# Patient Record
Sex: Male | Born: 1970 | Race: White | Hispanic: Yes | Marital: Single | Smoking: Current every day smoker
Health system: Southern US, Community
[De-identification: ages and names within clinical notes are randomized; demographics above are authoritative.]

---

## 2009-01-21 ENCOUNTER — Emergency Department (HOSPITAL_COMMUNITY): Admission: EM | Admit: 2009-01-21 | Discharge: 2009-01-21 | Payer: Self-pay | Admitting: Emergency Medicine
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
## 2017-10-11 ENCOUNTER — Encounter (HOSPITAL_COMMUNITY): Payer: Self-pay | Admitting: Emergency Medicine

## 2017-10-11 DIAGNOSIS — K3580 Unspecified acute appendicitis: Principal | ICD-10-CM | POA: Insufficient documentation

## 2017-10-11 LAB — COMPREHENSIVE METABOLIC PANEL
ALBUMIN: 4.4 g/dL (ref 3.5–5.0)
ALK PHOS: 113 U/L (ref 38–126)
ALT: 22 U/L (ref 17–63)
ANION GAP: 13 (ref 5–15)
AST: 21 U/L (ref 15–41)
BILIRUBIN TOTAL: 1.4 mg/dL — AB (ref 0.3–1.2)
BUN: 19 mg/dL (ref 6–20)
CALCIUM: 9.4 mg/dL (ref 8.9–10.3)
CO2: 20 mmol/L — ABNORMAL LOW (ref 22–32)
Chloride: 103 mmol/L (ref 101–111)
Creatinine, Ser: 0.99 mg/dL (ref 0.61–1.24)
GFR calc Af Amer: >60 mL/min (ref >60)
GLUCOSE: 131 mg/dL — AB (ref 65–99)
Potassium: 3.3 mmol/L — ABNORMAL LOW (ref 3.5–5.1)
Sodium: 136 mmol/L (ref 135–145)
TOTAL PROTEIN: 7.7 g/dL (ref 6.5–8.1)

## 2017-10-11 LAB — URINALYSIS, ROUTINE W REFLEX MICROSCOPIC
BACTERIA UA: NONE SEEN
Bilirubin Urine: NEGATIVE
GLUCOSE, UA: NEGATIVE mg/dL
Ketones, ur: 20 mg/dL — AB
Leukocytes, UA: NEGATIVE
NITRITE: NEGATIVE
Protein, ur: NEGATIVE mg/dL
SPECIFIC GRAVITY, URINE: 1.028 (ref 1.005–1.030)
Squamous Epithelial / LPF: NONE SEEN
pH: 6 (ref 5.0–8.0)

## 2017-10-11 LAB — CBC
HCT: 42.5 % (ref 39.0–52.0)
Hemoglobin: 15.1 g/dL (ref 13.0–17.0)
MCH: 30.6 pg (ref 26.0–34.0)
MCHC: 35.5 g/dL (ref 30.0–36.0)
MCV: 86.2 fL (ref 78.0–100.0)
Platelets: 266 10*3/uL (ref 150–400)
RBC: 4.93 MIL/uL (ref 4.22–5.81)
RDW: 13.4 % (ref 11.5–15.5)
WBC: 14.9 10*3/uL — AB (ref 4.0–10.5)

## 2017-10-11 LAB — LIPASE, BLOOD: Lipase: 26 U/L (ref 11–51)

## 2017-10-11 NOTE — ED Triage Notes (Signed)
Patient reports mid abdominal pain with emesis onset this afternoon , no diarrhea , denies fever or chills .  

## 2017-10-12 ENCOUNTER — Emergency Department (HOSPITAL_COMMUNITY): Payer: Self-pay | Admitting: Certified Registered Nurse Anesthetist

## 2017-10-12 ENCOUNTER — Encounter (HOSPITAL_COMMUNITY): Admission: EM | Disposition: A | Discharge status: Home / Self Care | Payer: Self-pay | Source: Home / Self Care

## 2017-10-12 ENCOUNTER — Other Ambulatory Visit: Payer: Self-pay

## 2017-10-12 ENCOUNTER — Observation Stay (HOSPITAL_COMMUNITY)
Admission: EM | Admit: 2017-10-12 | Discharge: 2017-10-13 | Disposition: A | Discharge status: Home / Self Care | Payer: Self-pay | Attending: Surgery | Admitting: Surgery

## 2017-10-12 ENCOUNTER — Encounter (HOSPITAL_COMMUNITY): Payer: Self-pay | Admitting: Surgery

## 2017-10-12 ENCOUNTER — Emergency Department (HOSPITAL_COMMUNITY): Payer: Self-pay

## 2017-10-12 DIAGNOSIS — K37 Unspecified appendicitis: Secondary | ICD-10-CM | POA: Diagnosis present

## 2017-10-12 DIAGNOSIS — K3580 Unspecified acute appendicitis: Secondary | ICD-10-CM | POA: Diagnosis present

## 2017-10-12 DIAGNOSIS — K3589 Other acute appendicitis without perforation or gangrene: Secondary | ICD-10-CM

## 2017-10-12 HISTORY — PX: LAPAROSCOPIC APPENDECTOMY: SHX408

## 2017-10-12 HISTORY — PX: APPENDECTOMY: SHX54

## 2017-10-12 SURGERY — APPENDECTOMY, LAPAROSCOPIC
Anesthesia: General

## 2017-10-12 MED ORDER — SUCCINYLCHOLINE CHLORIDE 20 MG/ML IJ SOLN
INTRAMUSCULAR | Status: DC | PRN
  Administered 2017-10-12: 110 mg via INTRAVENOUS

## 2017-10-12 MED ORDER — FENTANYL CITRATE (PF) 250 MCG/5ML IJ SOLN
INTRAMUSCULAR | Status: AC
  Filled 2017-10-12: qty 5

## 2017-10-12 MED ORDER — SUCCINYLCHOLINE CHLORIDE 200 MG/10ML IV SOSY
PREFILLED_SYRINGE | INTRAVENOUS | Status: AC
  Filled 2017-10-12: qty 10

## 2017-10-12 MED ORDER — OXYCODONE-ACETAMINOPHEN 5-325 MG PO TABS
1.0000 | ORAL_TABLET | ORAL | Status: DC | PRN
  Administered 2017-10-12 – 2017-10-13 (×2): 1 via ORAL
  Filled 2017-10-12: qty 1
  Filled 2017-10-12: qty 2

## 2017-10-12 MED ORDER — MORPHINE SULFATE (PF) 4 MG/ML IV SOLN
4.0000 mg | Freq: Once | INTRAVENOUS | Status: AC
  Administered 2017-10-12: 4 mg via INTRAVENOUS
  Filled 2017-10-12: qty 1

## 2017-10-12 MED ORDER — ROCURONIUM BROMIDE 100 MG/10ML IV SOLN
INTRAVENOUS | Status: DC | PRN
  Administered 2017-10-12: 50 mg via INTRAVENOUS

## 2017-10-12 MED ORDER — ONDANSETRON HCL 4 MG/2ML IJ SOLN
INTRAMUSCULAR | Status: AC
  Filled 2017-10-12: qty 2

## 2017-10-12 MED ORDER — SUGAMMADEX SODIUM 200 MG/2ML IV SOLN
INTRAVENOUS | Status: DC | PRN
  Administered 2017-10-12: 250.4 mg via INTRAVENOUS

## 2017-10-12 MED ORDER — METRONIDAZOLE IN NACL 5-0.79 MG/ML-% IV SOLN
500.0000 mg | Freq: Once | INTRAVENOUS | Status: AC
  Administered 2017-10-12: 500 mg via INTRAVENOUS
  Filled 2017-10-12: qty 100

## 2017-10-12 MED ORDER — EPHEDRINE 5 MG/ML INJ
INTRAVENOUS | Status: AC
  Filled 2017-10-12: qty 10

## 2017-10-12 MED ORDER — PHENYLEPHRINE 40 MCG/ML (10ML) SYRINGE FOR IV PUSH (FOR BLOOD PRESSURE SUPPORT)
PREFILLED_SYRINGE | INTRAVENOUS | Status: AC
  Filled 2017-10-12: qty 10

## 2017-10-12 MED ORDER — 0.9 % SODIUM CHLORIDE (POUR BTL) OPTIME
TOPICAL | Status: DC | PRN
  Administered 2017-10-12: 1000 mL

## 2017-10-12 MED ORDER — LACTATED RINGERS IV SOLN
INTRAVENOUS | Status: DC
  Administered 2017-10-12 (×2): via INTRAVENOUS

## 2017-10-12 MED ORDER — DEXAMETHASONE SODIUM PHOSPHATE 10 MG/ML IJ SOLN
INTRAMUSCULAR | Status: AC
  Filled 2017-10-12: qty 1

## 2017-10-12 MED ORDER — CEFTRIAXONE SODIUM 2 G IJ SOLR
2.0000 g | INTRAMUSCULAR | Status: DC
  Administered 2017-10-13: 2 g via INTRAVENOUS
  Filled 2017-10-12: qty 2

## 2017-10-12 MED ORDER — MIDAZOLAM HCL 2 MG/2ML IJ SOLN
INTRAMUSCULAR | Status: AC
  Filled 2017-10-12: qty 2

## 2017-10-12 MED ORDER — SODIUM CHLORIDE 0.9 % IV SOLN
INTRAVENOUS | Status: DC
  Administered 2017-10-12 – 2017-10-13 (×2): via INTRAVENOUS

## 2017-10-12 MED ORDER — FENTANYL CITRATE (PF) 100 MCG/2ML IJ SOLN
INTRAMUSCULAR | Status: DC | PRN
  Administered 2017-10-12: 100 ug via INTRAVENOUS

## 2017-10-12 MED ORDER — MORPHINE SULFATE (PF) 4 MG/ML IV SOLN: 2.0000 mg | INTRAVENOUS | Status: DC | PRN

## 2017-10-12 MED ORDER — IOPAMIDOL (ISOVUE-300) INJECTION 61%
INTRAVENOUS | Status: AC
  Administered 2017-10-12: 100 mL
  Filled 2017-10-12: qty 100

## 2017-10-12 MED ORDER — PROPOFOL 10 MG/ML IV BOLUS
INTRAVENOUS | Status: DC | PRN
  Administered 2017-10-12: 200 mg via INTRAVENOUS

## 2017-10-12 MED ORDER — HYDRALAZINE HCL 20 MG/ML IJ SOLN: 10.0000 mg | INTRAMUSCULAR | Status: DC | PRN

## 2017-10-12 MED ORDER — ONDANSETRON HCL 4 MG/2ML IJ SOLN: 4.0000 mg | Freq: Four times a day (QID) | INTRAMUSCULAR | Status: DC | PRN

## 2017-10-12 MED ORDER — PANTOPRAZOLE SODIUM 40 MG IV SOLR
40.0000 mg | Freq: Every day | INTRAVENOUS | Status: DC
  Administered 2017-10-12: 40 mg via INTRAVENOUS
  Filled 2017-10-12: qty 40

## 2017-10-12 MED ORDER — INFLUENZA VAC SPLIT QUAD 0.5 ML IM SUSY
0.5000 mL | PREFILLED_SYRINGE | INTRAMUSCULAR | Status: DC
  Filled 2017-10-12: qty 0.5

## 2017-10-12 MED ORDER — GLYCOPYRROLATE 0.2 MG/ML IJ SOLN
INTRAMUSCULAR | Status: DC | PRN
  Administered 2017-10-12: 0.2 mg via INTRAVENOUS

## 2017-10-12 MED ORDER — BUPIVACAINE-EPINEPHRINE (PF) 0.25% -1:200000 IJ SOLN
INTRAMUSCULAR | Status: AC
  Filled 2017-10-12: qty 30

## 2017-10-12 MED ORDER — SODIUM CHLORIDE 0.9 % IR SOLN
Status: DC | PRN
  Administered 2017-10-12: 1000 mL

## 2017-10-12 MED ORDER — SUGAMMADEX SODIUM 200 MG/2ML IV SOLN
INTRAVENOUS | Status: AC
  Filled 2017-10-12: qty 2

## 2017-10-12 MED ORDER — DEXAMETHASONE SODIUM PHOSPHATE 10 MG/ML IJ SOLN
INTRAMUSCULAR | Status: DC | PRN
  Administered 2017-10-12: 10 mg via INTRAVENOUS

## 2017-10-12 MED ORDER — OXYCODONE HCL 5 MG PO TABS
5.0000 mg | ORAL_TABLET | ORAL | Status: DC | PRN
Start: 2017-10-12 — End: 2017-10-13

## 2017-10-12 MED ORDER — LIDOCAINE HCL (CARDIAC) 20 MG/ML IV SOLN
INTRAVENOUS | Status: DC | PRN
  Administered 2017-10-12: 100 mg via INTRAVENOUS

## 2017-10-12 MED ORDER — DIPHENHYDRAMINE HCL 25 MG PO CAPS: 25.0000 mg | ORAL_CAPSULE | Freq: Four times a day (QID) | ORAL | Status: DC | PRN

## 2017-10-12 MED ORDER — DEXTROSE 5 % IV SOLN
2.0000 g | Freq: Once | INTRAVENOUS | Status: AC
  Administered 2017-10-12: 2 g via INTRAVENOUS
  Filled 2017-10-12: qty 2

## 2017-10-12 MED ORDER — ROCURONIUM BROMIDE 10 MG/ML (PF) SYRINGE
PREFILLED_SYRINGE | INTRAVENOUS | Status: AC
  Filled 2017-10-12: qty 5

## 2017-10-12 MED ORDER — ONDANSETRON 4 MG PO TBDP: 4.0000 mg | ORAL_TABLET | Freq: Four times a day (QID) | ORAL | Status: DC | PRN

## 2017-10-12 MED ORDER — DIPHENHYDRAMINE HCL 50 MG/ML IJ SOLN: 25.0000 mg | Freq: Four times a day (QID) | INTRAMUSCULAR | Status: DC | PRN

## 2017-10-12 MED ORDER — BUPIVACAINE-EPINEPHRINE 0.5% -1:200000 IJ SOLN
INTRAMUSCULAR | Status: DC | PRN
  Administered 2017-10-12: 13 mL

## 2017-10-12 MED ORDER — MIDAZOLAM HCL 5 MG/5ML IJ SOLN
INTRAMUSCULAR | Status: DC | PRN
  Administered 2017-10-12: 2 mg via INTRAVENOUS

## 2017-10-12 MED ORDER — PROPOFOL 10 MG/ML IV BOLUS
INTRAVENOUS | Status: AC
  Filled 2017-10-12: qty 20

## 2017-10-12 MED ORDER — LIDOCAINE 2% (20 MG/ML) 5 ML SYRINGE
INTRAMUSCULAR | Status: AC
  Filled 2017-10-12: qty 5

## 2017-10-12 MED ORDER — METRONIDAZOLE IN NACL 5-0.79 MG/ML-% IV SOLN
500.0000 mg | Freq: Three times a day (TID) | INTRAVENOUS | Status: DC
  Administered 2017-10-12 – 2017-10-13 (×3): 500 mg via INTRAVENOUS
  Filled 2017-10-12 (×5): qty 100

## 2017-10-12 MED ORDER — ONDANSETRON HCL 4 MG/2ML IJ SOLN
4.0000 mg | Freq: Once | INTRAMUSCULAR | Status: AC
  Administered 2017-10-12: 4 mg via INTRAVENOUS
  Filled 2017-10-12: qty 2

## 2017-10-12 MED ORDER — ONDANSETRON HCL 4 MG/2ML IJ SOLN
INTRAMUSCULAR | Status: DC | PRN
  Administered 2017-10-12: 4 mg via INTRAVENOUS

## 2017-10-12 SURGICAL SUPPLY — 44 items
APPLIER CLIP ROT 10 11.4 M/L (STAPLE)
BLADE CLIPPER SURG (BLADE) IMPLANT
CANISTER SUCT 3000ML PPV (MISCELLANEOUS) ×3 IMPLANT
CHLORAPREP W/TINT 26ML (MISCELLANEOUS) ×3 IMPLANT
CLIP APPLIE ROT 10 11.4 M/L (STAPLE) IMPLANT
COVER SURGICAL LIGHT HANDLE (MISCELLANEOUS) ×3 IMPLANT
CUTTER FLEX LINEAR 45M (STAPLE) ×3 IMPLANT
DERMABOND ADHESIVE PROPEN (GAUZE/BANDAGES/DRESSINGS) ×2
DERMABOND ADVANCED (GAUZE/BANDAGES/DRESSINGS) ×2
DERMABOND ADVANCED .7 DNX12 (GAUZE/BANDAGES/DRESSINGS) ×1 IMPLANT
DERMABOND ADVANCED .7 DNX6 (GAUZE/BANDAGES/DRESSINGS) ×1 IMPLANT
DRAPE WARM FLUID 44X44 (DRAPE) ×3 IMPLANT
ELECT REM PT RETURN 9FT ADLT (ELECTROSURGICAL) ×3
ELECTRODE REM PT RTRN 9FT ADLT (ELECTROSURGICAL) ×1 IMPLANT
ENDOLOOP SUT PDS II  0 18 (SUTURE)
ENDOLOOP SUT PDS II 0 18 (SUTURE) IMPLANT
GLOVE BIO SURGEON STRL SZ8 (GLOVE) ×3 IMPLANT
GLOVE BIOGEL PI IND STRL 6.5 (GLOVE) ×1 IMPLANT
GLOVE BIOGEL PI IND STRL 8 (GLOVE) ×1 IMPLANT
GLOVE BIOGEL PI INDICATOR 6.5 (GLOVE) ×2
GLOVE BIOGEL PI INDICATOR 8 (GLOVE) ×2
GLOVE SURG SS PI 6.5 STRL IVOR (GLOVE) ×3 IMPLANT
GOWN STRL REUS W/ TWL LRG LVL3 (GOWN DISPOSABLE) ×2 IMPLANT
GOWN STRL REUS W/ TWL XL LVL3 (GOWN DISPOSABLE) ×1 IMPLANT
GOWN STRL REUS W/TWL LRG LVL3 (GOWN DISPOSABLE) ×4
GOWN STRL REUS W/TWL XL LVL3 (GOWN DISPOSABLE) ×2
KIT BASIN OR (CUSTOM PROCEDURE TRAY) ×3 IMPLANT
KIT ROOM TURNOVER OR (KITS) ×3 IMPLANT
NS IRRIG 1000ML POUR BTL (IV SOLUTION) ×3 IMPLANT
PAD ARMBOARD 7.5X6 YLW CONV (MISCELLANEOUS) ×6 IMPLANT
POUCH SPECIMEN RETRIEVAL 10MM (ENDOMECHANICALS) ×3 IMPLANT
RELOAD STAPLE TA45 3.5 REG BLU (ENDOMECHANICALS) ×6 IMPLANT
SCISSORS LAP 5X35 DISP (ENDOMECHANICALS) ×3 IMPLANT
SET IRRIG TUBING LAPAROSCOPIC (IRRIGATION / IRRIGATOR) ×3 IMPLANT
SHEARS HARMONIC ACE PLUS 36CM (ENDOMECHANICALS) ×3 IMPLANT
SPECIMEN JAR SMALL (MISCELLANEOUS) ×3 IMPLANT
SUT MON AB 4-0 PC3 18 (SUTURE) ×3 IMPLANT
TOWEL OR 17X24 6PK STRL BLUE (TOWEL DISPOSABLE) ×3 IMPLANT
TOWEL OR 17X26 10 PK STRL BLUE (TOWEL DISPOSABLE) ×3 IMPLANT
TRAY FOLEY CATH SILVER 16FR (SET/KITS/TRAYS/PACK) ×3 IMPLANT
TRAY LAPAROSCOPIC MC (CUSTOM PROCEDURE TRAY) ×3 IMPLANT
TROCAR XCEL BLADELESS 5X75MML (TROCAR) ×6 IMPLANT
TROCAR XCEL BLUNT TIP 100MML (ENDOMECHANICALS) ×3 IMPLANT
TUBING INSUFFLATION (TUBING) ×3 IMPLANT

## 2017-10-12 NOTE — H&P (Signed)
Douglas Rojas  Douglas Rojas 1971/10/27  [id number].    Requesting Provider: Quincy Carnes, PA-C Chief Complaint/Reason for Consult: acute apppendicitis  HPI:  [name] is a 46 yo male with no significant pmhx who presented to [facility] due to acute RLQ pain. He states the pain began yesterday around 1500 and was diffuse across his lower abdomen that then localized to his RLQ. Pain is intense, throbbing, aggravated by lying supine with no alleviating factors at home. He also reports nausea and vomiting. Last episode of emesis he describes as green. Last BM yesterday - no melena, hematochezia. He denies fever, chills, chest pain, SHOB, urinary symptoms.  He has no past surgical history. Takes no medications. LOI yesterday at 1330.  ROS: Review of Systems  Constitutional: Negative for chills and fever.  Respiratory: Negative for shortness of breath.   Cardiovascular: Negative for chest pain and palpitations.  Gastrointestinal: Positive for abdominal pain and vomiting. Negative for blood in stool and melena.  Genitourinary: Negative.   Skin: Negative for rash.    No family history on file.  History reviewed. No pertinent past medical history.  History reviewed. No pertinent surgical history.  Social History:  reports that he has been smoking.  he has never used smokeless tobacco. He reports that he drinks alcohol. He reports that he does not use drugs.  Allergies: No Known Allergies   (Not in a hospital admission)  Blood pressure 120/81, pulse (!) 50, temperature 98.5 F (36.9 C), temperature source Oral, resp. rate 14, height 5' 5"  (1.651 m), weight 62.6 kg (138 lb), SpO2 99 %. Physical Exam: Physical Exam  Constitutional: He is oriented to person, place, and time. He appears well-developed and well-nourished.  Non-toxic appearance. No distress.  HENT:  Head: Normocephalic and atraumatic.  Eyes: Pupils are equal, round, and reactive to light.   Cardiovascular: Normal rate, regular rhythm, normal heart sounds and intact distal pulses. Exam reveals no gallop and no friction rub.  No murmur heard. Pulmonary/Chest: Effort normal and breath sounds normal. No respiratory distress.  Abdominal: Soft. Normal appearance and bowel sounds are normal. There is tenderness in the right lower quadrant. There is tenderness at McBurney's point. There is negative Murphy's sign.  Neurological: He is alert and oriented to person, place, and time.  Skin: Skin is warm and dry. Capillary refill takes less than 2 seconds.  Psychiatric: He has a normal mood and affect. His behavior is normal.    Results for orders placed or performed during the hospital encounter of 10/12/17 (from the past 48 hour(s))  Lipase, blood     Status: None   Collection Time: 10/11/17  7:51 PM  Result Value Ref Range   Lipase 26 11 - 51 U/L  Comprehensive metabolic panel     Status: Abnormal   Collection Time: 10/11/17  7:51 PM  Result Value Ref Range   Sodium 136 135 - 145 mmol/L   Potassium 3.3 (L) 3.5 - 5.1 mmol/L   Chloride 103 101 - 111 mmol/L   CO2 20 (L) 22 - 32 mmol/L   Glucose, Bld 131 (H) 65 - 99 mg/dL   BUN 19 6 - 20 mg/dL   Creatinine, Ser 0.99 0.61 - 1.24 mg/dL   Calcium 9.4 8.9 - 10.3 mg/dL   Total Protein 7.7 6.5 - 8.1 g/dL   Albumin 4.4 3.5 - 5.0 g/dL   AST 21 15 - 41 U/L   ALT 22 17 - 63 U/L   Alkaline Phosphatase  113 38 - 126 U/L   Total Bilirubin 1.4 (H) 0.3 - 1.2 mg/dL   GFR calc non Af Amer >60 >60 mL/min   GFR calc Af Amer >60 >60 mL/min    Comment: (Rojas) The eGFR has been calculated using the CKD EPI equation. This calculation has not been validated in all clinical situations. eGFR's persistently <60 mL/min signify possible Chronic Kidney Disease.    Anion gap 13 5 - 15  CBC     Status: Abnormal   Collection Time: 10/11/17  7:51 PM  Result Value Ref Range   WBC 14.9 (H) 4.0 - 10.5 K/uL   RBC 4.93 4.22 - 5.81 MIL/uL   Hemoglobin 15.1  13.0 - 17.0 g/dL   HCT 42.5 39.0 - 52.0 %   MCV 86.2 78.0 - 100.0 fL   MCH 30.6 26.0 - 34.0 pg   MCHC 35.5 30.0 - 36.0 g/dL   RDW 13.4 11.5 - 15.5 %   Platelets 266 150 - 400 K/uL  Urinalysis, Routine w reflex microscopic     Status: Abnormal   Collection Time: 10/11/17  7:53 PM  Result Value Ref Range   Color, Urine YELLOW YELLOW   APPearance CLEAR CLEAR   Specific Gravity, Urine 1.028 1.005 - 1.030   pH 6.0 5.0 - 8.0   Glucose, UA NEGATIVE NEGATIVE mg/dL   Hgb urine dipstick MODERATE (A) NEGATIVE   Bilirubin Urine NEGATIVE NEGATIVE   Ketones, ur 20 (A) NEGATIVE mg/dL   Protein, ur NEGATIVE NEGATIVE mg/dL   Nitrite NEGATIVE NEGATIVE   Leukocytes, UA NEGATIVE NEGATIVE   RBC / HPF 0-5 0 - 5 RBC/hpf   WBC, UA 0-5 0 - 5 WBC/hpf   Bacteria, UA NONE SEEN NONE SEEN   Squamous Epithelial / LPF NONE SEEN NONE SEEN   Mucus PRESENT    Ct Abdomen Pelvis W Contrast  Result Date: 10/12/2017 CLINICAL DATA:  46 year old male with abdominal pain. Concern for acute appendicitis. EXAM: CT ABDOMEN AND PELVIS WITH CONTRAST TECHNIQUE: Multidetector CT imaging of the abdomen and pelvis was performed using the standard protocol following bolus administration of intravenous contrast. CONTRAST:  115m ISOVUE-300 IOPAMIDOL (ISOVUE-300) INJECTION 61% COMPARISON:  None. FINDINGS: Evaluation of this exam is limited due to respiratory motion artifact. Lower chest: Minimal bibasilar atelectatic changes. The visualized lung bases are otherwise clear. Borderline cardiomegaly. No intra-abdominal free air or free fluid. Hepatobiliary: No focal liver abnormality is seen. No gallstones, gallbladder wall thickening, or biliary dilatation. Pancreas: Unremarkable. No pancreatic ductal dilatation or surrounding inflammatory changes. Spleen: Normal in size without focal abnormality. Adrenals/Urinary Tract: Adrenal glands are unremarkable. Kidneys are normal, without renal calculi, focal lesion, or hydronephrosis. Bladder is  unremarkable. Stomach/Bowel: There is no evidence of bowel obstruction. The appendix appears thickened measuring 13 mm in diameter. There is mild enhancement of the appendiceal mucosa. No significant periappendiceal inflammatory changes. There is a small amount of fluid within the lumen of the appendix. Tiny noncalcified appendicolith is likely present in the distal lumen and tip of the appendix. The appendix is located in the right lower quadrant inferior to the cecum extending inferiorly into the right hemipelvis. There is no evidence of appendiceal perforation or abscess. Vascular/Lymphatic: No significant vascular findings are present. No enlarged abdominal or pelvic lymph nodes. Reproductive: The prostate and seminal vesicles are grossly unremarkable. Other: None Musculoskeletal: No acute or significant osseous findings. IMPRESSION: Prominent appendix with enhancing mucosa and probable noncalcified appendicolith in the distal lumen. No significant periappendiceal inflammation. Findings likely represent  an early acute appendicitis. Clinical correlation is recommended. No abscess or perforation. Evaluation is somewhat limited due to respiratory motion. Electronically Signed   By: Anner Crete M.D.   On: 10/12/2017 06:09    Assessment/Plan Acute appendicitis - CT showing prominent appendix with enhancing mucosa and probable appendicolith in the distal lumen. No periappendiceal inflammation, abscess, or perforation. - likely to OR today for laparoscopic appendectomy - NPO and IV pain control - prophylactic abx  1.  Admit to [place] service 2.  NPO, IVF 3.  SCD's  FEN: NPO, IVF ID: ceftriaxone 12/4>> DVT: SCDs  Tonny Branch, PA-S2  10/12/2017, 7:02 AM

## 2017-10-12 NOTE — Anesthesia Preprocedure Evaluation (Signed)
Anesthesia Evaluation  Patient identified by MRN, date of birth, ID band Patient awake    Reviewed: Allergy & Precautions, NPO status , Patient's Chart, lab work & pertinent test results  Airway Mallampati: II   Neck ROM: Full    Dental no notable dental hx.    Pulmonary Current Smoker,    breath sounds clear to auscultation       Cardiovascular  Rhythm:Regular Rate:Normal     Neuro/Psych negative neurological ROS     GI/Hepatic Neg liver ROS, Appendicitis    Endo/Other  negative endocrine ROS  Renal/GU negative Renal ROS     Musculoskeletal   Abdominal   Peds  Hematology negative hematology ROS (+)   Anesthesia Other Findings   Reproductive/Obstetrics                             Anesthesia Physical Anesthesia Plan  ASA: I  Anesthesia Plan: General   Post-op Pain Management:    Induction: Intravenous  PONV Risk Score and Plan: 2 and Treatment may vary due to age or medical condition  Airway Management Planned: Oral ETT  Additional Equipment:   Intra-op Plan:   Post-operative Plan: Extubation in OR  Informed Consent: I have reviewed the patients History and Physical, chart, labs and discussed the procedure including the risks, benefits and alternatives for the proposed anesthesia with the patient or authorized representative who has indicated his/her understanding and acceptance.   Dental advisory given  Plan Discussed with: CRNA  Anesthesia Plan Comments:         Anesthesia Quick Evaluation

## 2017-10-12 NOTE — Anesthesia Procedure Notes (Signed)
Procedure Name: Intubation Date/Time: 10/12/2017 11:19 AM Performed by: Shirlyn Goltz, CRNA Pre-anesthesia Checklist: Patient identified, Emergency Drugs available, Suction available and Patient being monitored Patient Re-evaluated:Patient Re-evaluated prior to induction Oxygen Delivery Method: Circle system utilized Preoxygenation: Pre-oxygenation with 100% oxygen Induction Type: IV induction and Rapid sequence Laryngoscope Size: Mac and 4 Grade View: Grade I Tube type: Oral Tube size: 7.5 mm Number of attempts: 1 Airway Equipment and Method: Stylet Placement Confirmation: ETT inserted through vocal cords under direct vision,  positive ETCO2 and breath sounds checked- equal and bilateral Secured at: 21 cm Tube secured with: Tape Dental Injury: Teeth and Oropharynx as per pre-operative assessment

## 2017-10-12 NOTE — Transfer of Care (Signed)
Immediate Anesthesia Transfer of Care Note  Patient: Douglas Rojas  Procedure(s) Performed: APPENDECTOMY LAPAROSCOPIC (N/A )  Patient Location: PACU  Anesthesia Type:General  Level of Consciousness: awake, alert , oriented and patient cooperative  Airway & Oxygen Therapy: Patient Spontanous Breathing and Patient connected to nasal cannula oxygen  Post-op Assessment: Report given to RN and Post -op Vital signs reviewed and stable  Post vital signs: Reviewed and stable  Last Vitals:  Vitals:   10/12/17 0945 10/12/17 1000  BP: 101/71 96/69  Pulse: (!) 45 77  Resp:    Temp:    SpO2: 96% 100%    Last Pain:  Vitals:   10/12/17 0730  TempSrc:   PainSc: 2          Complications: No apparent anesthesia complications

## 2017-10-12 NOTE — Interval H&P Note (Signed)
History and Physical Interval Note:  10/12/2017 11:05 AM  Douglas Rojas  has presented today for surgery, with the diagnosis of ACUTE APPENDICITIS  The various methods of treatment have been discussed with the patient and family. After consideration of risks, benefits and other options for treatment, the patient has consented to  Procedure(s): APPENDECTOMY LAPAROSCOPIC (N/A) as a surgical intervention .  The patient's history has been reviewed, patient examined, no change in status, stable for surgery.  I have reviewed the patient's chart and labs.  Questions were answered to the patient's satisfaction.    Pt seen examined and agree Acute appendicitis Discussed laparoscopic appendectomy and medical treatment  Pt has opted for appendectomy The procedure has been discussed with the patient.  Alternative therapies have been discussed with the patient.  Operative risks include bleeding,  Infection,  Organ injury,  Nerve injury,  Blood vessel injury,  DVT,  Pulmonary embolism,  Death,  And possible reoperation.  Medical management risks include worsening of present situation.  The success of the procedure is 50 -90 % at treating patients symptoms.  The patient understands and agrees to proceed.   Vernessa Likes A Alexa Golebiewski

## 2017-10-12 NOTE — ED Provider Notes (Signed)
[facility]   CSN: [id number] Arrival date & time: 10/11/17  1926     History   Chief Complaint Chief Complaint  Patient presents with  . Abdominal Pain    HPI Jolly Cephus Richerguilar is a 46 y.o. male.  The history is provided by the patient and medical records.  Abdominal Pain   Associated symptoms include nausea and vomiting.    46 year old male with no significant past medical history presenting to the ED with abdominal pain.  Reports this began around 3:00 this afternoon and has been steadily worsening since.  Reports initially pain was all the way across his mid and lower abdomen, but is since localized to the right lower quadrant.  He reports some chills but denies fever.  He ate breakfast but has not really wanted to eat the rest of the day.  Has had some nausea and vomiting.  Denies any diarrhea.  No prior abdominal surgeries.  No intervention tried prior to arrival.  History reviewed. No pertinent past medical history.  There are no active problems to display for this patient.   History reviewed. No pertinent surgical history.     Home Medications    Prior to Admission medications   Not on File    Family History No family history on file.  Social History Social History   Tobacco Use  . Smoking status: Current Every Day Smoker  . Smokeless tobacco: Never Used  Substance Use Topics  . Alcohol use: Yes  . Drug use: No     Allergies   Patient has no known allergies.   Review of Systems Review of Systems  Gastrointestinal: Positive for abdominal pain, nausea and vomiting.  All other systems reviewed and are negative.    Physical Exam Updated Vital Signs BP 114/69   Pulse 83   Temp 98.6 F (37 C) (Oral)   Resp 16   Ht 5\' 5"  (1.651 m)   Wt 62.6 kg (138 lb)   SpO2 97%   BMI 22.96 kg/m   Physical Exam  Constitutional: He is oriented to person, place, and time. He appears well-developed and  well-nourished.  HENT:  Head: Normocephalic and atraumatic.  Mouth/Throat: Oropharynx is clear and moist.  Eyes: Conjunctivae and EOM are normal. Pupils are equal, round, and reactive to light.  Neck: Normal range of motion.  Cardiovascular: Normal rate, regular rhythm and normal heart sounds.  Pulmonary/Chest: Effort normal and breath sounds normal. No stridor. No respiratory distress.  Abdominal: Soft. Bowel sounds are normal. There is tenderness in the right lower quadrant. There is tenderness at McBurney's point.  Musculoskeletal: Normal range of motion.  Neurological: He is alert and oriented to person, place, and time.  Skin: Skin is warm and dry.  Psychiatric: He has a normal mood and affect.  Nursing note and vitals reviewed.    ED Treatments / Results  Labs (all labs ordered are listed, but only abnormal results are displayed) Labs Reviewed  COMPREHENSIVE METABOLIC PANEL - Abnormal; Notable for the following components:      Result Value   Potassium 3.3 (*)    CO2 20 (*)    Glucose, Bld 131 (*)    Total Bilirubin 1.4 (*)    All other components within normal limits  CBC - Abnormal; Notable for the following components:   WBC 14.9 (*)    All other components within normal limits  URINALYSIS, ROUTINE W REFLEX MICROSCOPIC - Abnormal; Notable for the following components:  Hgb urine dipstick MODERATE (*)    Ketones, ur 20 (*)    All other components within normal limits  LIPASE, BLOOD    EKG  EKG Interpretation None       Radiology Ct Abdomen Pelvis W Contrast  Result Date: 10/12/2017 CLINICAL DATA:  46 year old male with abdominal pain. Concern for acute appendicitis. EXAM: CT ABDOMEN AND PELVIS WITH CONTRAST TECHNIQUE: Multidetector CT imaging of the abdomen and pelvis was performed using the standard protocol following bolus administration of intravenous contrast. CONTRAST:  ISOVUE-300 IOPAMIDOL (ISOVUE-300) INJECTION 61% COMPARISON:  None. FINDINGS:  Evaluation of this exam is limited due to respiratory motion artifact. Lower chest: Minimal bibasilar atelectatic changes. The visualized lung bases are otherwise clear. Borderline cardiomegaly. No intra-abdominal free air or free fluid. Hepatobiliary: No focal liver abnormality is seen. No gallstones, gallbladder wall thickening, or biliary dilatation. Pancreas: Unremarkable. No pancreatic ductal dilatation or surrounding inflammatory changes. Spleen: Normal in size without focal abnormality. Adrenals/Urinary Tract: Adrenal glands are unremarkable. Kidneys are normal, without renal calculi, focal lesion, or hydronephrosis. Bladder is unremarkable. Stomach/Bowel: There is no evidence of bowel obstruction. The appendix appears thickened measuring 13 mm in diameter. There is mild enhancement of the appendiceal mucosa. No significant periappendiceal inflammatory changes. There is a small amount of fluid within the lumen of the appendix. Tiny noncalcified appendicolith is likely present in the distal lumen and tip of the appendix. The appendix is located in the right lower quadrant inferior to the cecum extending inferiorly into the right hemipelvis. There is no evidence of appendiceal perforation or abscess. Vascular/Lymphatic: No significant vascular findings are present. No enlarged abdominal or pelvic lymph nodes. Reproductive: The prostate and seminal vesicles are grossly unremarkable. Other: None Musculoskeletal: No acute or significant osseous findings. IMPRESSION: Prominent appendix with enhancing mucosa and probable noncalcified appendicolith in the distal lumen. No significant periappendiceal inflammation. Findings likely represent an early acute appendicitis. Clinical correlation is recommended. No abscess or perforation. Evaluation is somewhat limited due to respiratory motion. Electronically Signed   By: Elgie Collard M.D.   On: 10/12/2017 06:09    Procedures Procedures (including critical care  time)  Medications Ordered in ED Medications  cefTRIAXone (ROCEPHIN) 2 g in dextrose 5 % 50 mL IVPB (not administered)    And  metroNIDAZOLE (FLAGYL) IVPB 500 mg (not administered)  morphine 4 MG/ML injection 4 mg (not administered)  morphine 4 MG/ML injection 4 mg (4 mg Intravenous Given 10/12/17 0415)  ondansetron (ZOFRAN) injection 4 mg (4 mg Intravenous Given 10/12/17 0412)  iopamidol (ISOVUE-300) 61 % injection (100 mLs  Contrast Given 10/12/17 0441)     Initial Impression / Assessment and Plan / ED Course  I have reviewed the triage vital signs and the nursing notes.  Pertinent labs & imaging results that were available during my care of the patient were reviewed by me and considered in my medical decision making (see chart for details).  46 year old male here with abdominal pain.  Reports subjective fever, nausea, and vomiting.  Pain initially generalized, now localized to right lower quadrant.  Patient is afebrile, nontoxic.  Does have focal tenderness in the right lower quadrant at McBurney's point.  Screening labs obtained from triage with leukocytosis.  Clinical concern for appendicitis.  Will give pain and nausea medications, obtain CT scan.  CT does confirm prominent appendix with enhancing mucosa and likely appendicolith and distal lumen.  Findings are consistent with early appendicitis.  No signs of abscess or perforation.  Patient be started  on preop antibiotics, consult general surgery.  Patient and wife updated.  Additional pain medications ordered as pain has returned.  Discussed with Dr. Dwain SarnaWakefield-- he will come and admit for ongoing care.  Final Clinical Impressions(s) / ED Diagnoses   Final diagnoses:  Other acute appendicitis    ED Discharge Orders    None       Garlon HatchetSanders, Cecilio Ohlrich M, PA-C 10/12/17 [id number]    Shon BatonHorton, Courtney F, MD 10/12/17 [id number]

## 2017-10-12 NOTE — Op Note (Addendum)
Appendectomy, Lap, Procedure Note  Indications: The patient presented with a history of right-sided abdominal pain. A CT revealed findings consistent with acute appendicitis. The procedure has been discussed with the patient.  Alternative therapies have been discussed with the patient.  Operative risks include bleeding,  Infection, stump leak,    Organ injury,  Nerve injury,  Open procedure , abscess,  Blood vessel injury,  DVT,  Pulmonary embolism,  Death,  And possible reoperation.  Medical management risks include worsening of present situation.  The success of the procedure is 50 -90 % at treating patients symptoms.  The patient understands and agrees to proceed.  Pre-operative Diagnosis: Acute appendicitis without mention of peritonitis  Post-operative Diagnosis: Same  Surgeon: Clovis Puhomas A Jedadiah Abdallah   Assistants: Orson SlickBowman RNFA  Anesthesia: General endotracheal anesthesia and Local anesthesia 0.25.% bupivacaine, with epinephrine  ASA Class: 1  Procedure Details  The patient was seen again in the Holding Room. The risks, benefits, complications, treatment options, and expected outcomes were discussed with the patient and/or family. The possibilities of reaction to medication, pulmonary aspiration, perforation of viscus, bleeding, recurrent infection, finding a normal appendix, the need for additional procedures, failure to diagnose a condition, and creating a complication requiring transfusion or operation were discussed. There was concurrence with the proposed plan and informed consent was obtained. The site of surgery was properly noted/marked. The patient was taken to Operating Room, identified as Douglas Bottomsanulfo Rojas and the procedure verified as Appendectomy. A Time Out was held and the above information confirmed.  The patient was placed in the supine position and general anesthesia was induced, along with placement of orogastric tube, Venodyne boots, and a Foley catheter. The abdomen was prepped  and draped in a sterile fashion. A one centimeter infraumbilical incision was made and the peritoneal cavity was accessed using the OPEN  technique. The pneumoperitoneum was then established to steady pressure of 12 mmHg. A 12 mm port was placed through the umbilical incision. Additional 5 mm cannulas then placed in the upper and lower midline of the abdomen. A careful evaluation of the entire abdomen was carried out. The patient was placed in Trendelenburg and left lateral decubitus position. The small intestines were retracted in the cephalad and left lateral direction away from the pelvis and right lower quadrant. The patient was found to have an enlarged and inflamed appendix that was extending into the pelvis. There was no evidence of perforation.  The appendix was carefully dissected. A window was made in the mesoappendix at the base of the appendix. A harmonic scalpel was used across the mesoappendix. The appendix was divided at its base using an endo-GIA stapler. Minimal appendiceal stump was left in place. There was no evidence of bleeding, leakage, or complication after division of the appendix. Irrigation was also performed and irrigate suctioned from the abdomen as well.  The umbilical port site was closed using 0 vicryl pursestring sutures fashion at the level of the fascia. The trocar site skin wounds were closed using skin staples.  Instrument, sponge, and needle counts were correct at the conclusion of the case.   Findings: The appendix was found to be inflamed. There were not signs of necrosis.  There was not perforation. There was not abscess formation.  Estimated Blood Loss:  less than 50 mL         Drains: NONE         Total IV Fluids: per anesthesia record         Specimens: appendix  Complications:  None; patient tolerated the procedure well.         Disposition: PACU - hemodynamically stable.         Condition: stable

## 2017-10-12 NOTE — Anesthesia Postprocedure Evaluation (Signed)
Anesthesia Post Note  Patient: Douglas Rojas  Procedure(s) Performed: APPENDECTOMY LAPAROSCOPIC (N/A )     Patient location during evaluation: PACU Anesthesia Type: General Level of consciousness: awake and sedated Pain management: pain level controlled Vital Signs Assessment: post-procedure vital signs reviewed and stable Respiratory status: spontaneous breathing, nonlabored ventilation, respiratory function stable and patient connected to nasal cannula oxygen Cardiovascular status: blood pressure returned to baseline and stable Postop Assessment: no apparent nausea or vomiting Anesthetic complications: no    Last Vitals:  Vitals:   10/12/17 1300 10/12/17 1330  BP: 115/74 124/81  Pulse: 67 70  Resp: 11 13  Temp: 36.7 C 36.6 C  SpO2: 100% 100%    Last Pain:  Vitals:   10/12/17 1330  TempSrc: Oral  PainSc:                  Dejon Jungman,JAMES TERRILL

## 2017-10-13 ENCOUNTER — Encounter (HOSPITAL_COMMUNITY): Payer: Self-pay | Admitting: Surgery

## 2017-10-13 LAB — CBC
HEMATOCRIT: 39.2 % (ref 39.0–52.0)
HEMOGLOBIN: 13.1 g/dL (ref 13.0–17.0)
MCH: 29.6 pg (ref 26.0–34.0)
MCHC: 33.4 g/dL (ref 30.0–36.0)
MCV: 88.5 fL (ref 78.0–100.0)
Platelets: 250 10*3/uL (ref 150–400)
RBC: 4.43 MIL/uL (ref 4.22–5.81)
RDW: 14 % (ref 11.5–15.5)
WBC: 10.1 10*3/uL (ref 4.0–10.5)

## 2017-10-13 LAB — BASIC METABOLIC PANEL
ANION GAP: 11 (ref 5–15)
BUN: 18 mg/dL (ref 6–20)
CHLORIDE: 102 mmol/L (ref 101–111)
CO2: 23 mmol/L (ref 22–32)
Calcium: 8.5 mg/dL — ABNORMAL LOW (ref 8.9–10.3)
Creatinine, Ser: 0.97 mg/dL (ref 0.61–1.24)
GFR calc non Af Amer: >60 mL/min (ref >60)
Glucose, Bld: 151 mg/dL — ABNORMAL HIGH (ref 65–99)
POTASSIUM: 3.9 mmol/L (ref 3.5–5.1)
SODIUM: 136 mmol/L (ref 135–145)

## 2017-10-13 LAB — HIV ANTIBODY (ROUTINE TESTING W REFLEX): HIV Screen 4th Generation wRfx: NONREACTIVE

## 2017-10-13 MED ORDER — OXYCODONE-ACETAMINOPHEN 5-325 MG PO TABS: 1.0000 | ORAL_TABLET | ORAL | 0 refills | Status: AC | PRN

## 2017-10-13 NOTE — Discharge Summary (Signed)
[facility]/Trauma Discharge Summary   Patient ID: Douglas BottomsRanulfo Rojas MRN: [id number] DOB/AGE: [birth year] 44 y.o.  Admit date: 10/12/2017 Discharge date: 10/13/2017  Admitting Diagnosis: appendicitis  Discharge Diagnosis Patient Active Problem List   Diagnosis Date Noted  . Acute appendicitis 10/12/2017  . Appendicitis 10/12/2017    Consultants none  Imaging: Ct Abdomen Pelvis W Contrast  Result Date: 10/12/2017 CLINICAL DATA:  46 year old male with abdominal pain. Concern for acute appendicitis. EXAM: CT ABDOMEN AND PELVIS WITH CONTRAST TECHNIQUE: Multidetector CT imaging of the abdomen and pelvis was performed using the standard protocol following bolus administration of intravenous contrast. CONTRAST:  100mL ISOVUE-300 IOPAMIDOL (ISOVUE-300) INJECTION 61% COMPARISON:  None. FINDINGS: Evaluation of this exam is limited due to respiratory motion artifact. Lower chest: Minimal bibasilar atelectatic changes. The visualized lung bases are otherwise clear. Borderline cardiomegaly. No intra-abdominal free air or free fluid. Hepatobiliary: No focal liver abnormality is seen. No gallstones, gallbladder wall thickening, or biliary dilatation. Pancreas: Unremarkable. No pancreatic ductal dilatation or surrounding inflammatory changes. Spleen: Normal in size without focal abnormality. Adrenals/Urinary Tract: Adrenal glands are unremarkable. Kidneys are normal, without renal calculi, focal lesion, or hydronephrosis. Bladder is unremarkable. Stomach/Bowel: There is no evidence of bowel obstruction. The appendix appears thickened measuring 13 mm in diameter. There is mild enhancement of the appendiceal mucosa. No significant periappendiceal inflammatory changes. There is a small amount of fluid within the lumen of the appendix. Tiny noncalcified appendicolith is likely present in the distal lumen and tip of the appendix. The appendix is located in the right lower quadrant inferior to the cecum  extending inferiorly into the right hemipelvis. There is no evidence of appendiceal perforation or abscess. Vascular/Lymphatic: No significant vascular findings are present. No enlarged abdominal or pelvic lymph nodes. Reproductive: The prostate and seminal vesicles are grossly unremarkable. Other: None Musculoskeletal: No acute or significant osseous findings. IMPRESSION: Prominent appendix with enhancing mucosa and probable noncalcified appendicolith in the distal lumen. No significant periappendiceal inflammation. Findings likely represent an early acute appendicitis. Clinical correlation is recommended. No abscess or perforation. Evaluation is somewhat limited due to respiratory motion. Electronically Signed   By: Elgie CollardArash  Radparvar M.D.   On: 10/12/2017 06:09    Procedures Dr. Luisa Hartornett (10/12/17) - Laparoscopic Appendectomy  Hospital Course:  Pt is a 46 year old male who [facility].  Workup showed appendicitis.  Patient was admitted and underwent procedure listed above. Tolerated procedure well and was transferred to the floor. Diet was advanced as tolerated.  On POD#1, the patient was voiding well, tolerating diet, ambulating well, pain well controlled, vital signs stable, incisions c/d/i and felt stable for discharge home.  Patient will follow up in our office in 2 weeks and knows to call with questions or concerns.  She will call to confirm appointment date/time.    Patient was discharged in good condition.  The [place] Sub[place] controlled database was reviewed prior to prescribing narcotic pain medication to this patient.  Physical Exam: General:  Alert, NAD, pleasant, cooperative Cardio: RRR, S1 & S2 normal, no murmur, rubs, gallops Resp: Effort normal, lungs CTA bilaterally, no wheezes, rales, rhonchi Abd:  Soft, ND, normal bowel sounds, no tenderness, incisions C/D/I Skin: no rashes noted, warm and dry  Allergies as of 10/13/2017   No Known Allergies      Medication List    TAKE these medications   oxyCODONE-acetaminophen 5-325 MG tablet Commonly known as:  PERCOCET/ROXICET Take 1-2 tablets by mouth every 4 (four) hours  as needed for moderate pain.        Follow-up Information    [facility] [place] Call.   Specialty:  General Surgery Why:  we are setting up a follow up appointment for you. Please call our office to see when your appointment is.  Contact information: [address] [place] [id number] [phone]          Signed: Joyce CopaJessica L [facility] 10/13/2017, 9:15 AM Pager: [phone] Consults: [phone] Mon-Fri 7:00 am-4:30 pm Sat-Sun 7:00 am-11:30 am

## 2017-10-13 NOTE — Discharge Instructions (Signed)
[facility] ______[facility] LAPAROSCOPIC SURGERY: POST OP INSTRUCTIONS Always review your discharge instruction sheet given to you by the facility where your surgery was performed. IF YOU HAVE DISABILITY OR FAMILY LEAVE FORMS, YOU MUST BRING THEM TO THE OFFICE FOR PROCESSING.   DO NOT GIVE THEM TO YOUR DOCTOR.  1. A prescription for pain medication may be given to you upon discharge.  Take your pain medication as prescribed, if needed.  If narcotic pain medicine is not needed, then you may take acetaminophen (Tylenol) or ibuprofen (Advil) as needed. 2. Take your usually prescribed medications unless otherwise directed. 3. If you need a refill on your pain medication, please contact your pharmacy.  They will contact our office to request authorization. Prescriptions will not be filled after 5pm or on week-ends. 4. You should follow a light diet the first few days after arrival home, such as soup and crackers, etc.  Be sure to include lots of fluids daily. 5. Most patients will experience some swelling and bruising in the area of the incisions.  Ice packs will help.  Swelling and bruising can take several days to resolve.  6. It is common to experience some constipation if taking pain medication after surgery.  Increasing fluid intake and taking a stool softener (such as Colace) will usually help or prevent this problem from occurring.  A mild laxative (Milk of Magnesia or Miralax) should be taken according to package instructions if there are no bowel movements after 48 hours. 7. Unless discharge instructions indicate otherwise, you may remove your bandages 24-48 hours after surgery, and you may shower at that time.  You may have steri-strips (small skin tapes) in place directly over the incision.  These strips should be left on the skin for 7-10 days.  If your surgeon used skin glue on the incision, you may shower in 24 hours.  The glue will flake off over the next 2-3 weeks.  Any sutures or  staples will be removed at the office during your follow-up visit. 8. ACTIVITIES:  You may resume regular (light) daily activities beginning the next day--such as daily self-care, walking, climbing stairs--gradually increasing activities as tolerated.  You may have sexual intercourse when it is comfortable.  Refrain from any heavy lifting or straining until approved by your doctor. a. You may drive when you are no longer taking prescription pain medication, you can comfortably wear a seatbelt, and you can safely maneuver your car and apply brakes. b. RETURN TO WORK:  _One week after surgery 9. You should see your doctor in the office for a follow-up appointment approximately 2-3 weeks after your surgery.  Make sure that you call for this appointment within a day or two after you arrive home to insure a convenient appointment time. 10. OTHER INSTRUCTIONS: __________________________________________________________________________________________________________________________ __________________________________________________________________________________________________________________________ WHEN TO CALL YOUR DOCTOR: 1. Fever over 101.0 2. Inability to urinate 3. Continued bleeding from incision. 4. Increased pain, redness, or drainage from the incision. 5. Increasing abdominal pain  The clinic staff is available to answer your questions during regular business hours.  Please dont hesitate to call and ask to speak to one of the nurses for clinical concerns.  If you have a medical emergency, go to the nearest emergency room or call 911.  A surgeon from [facility] is always on call at the hospital. [address], [address], [place], [place]  [id number] ? [address] [address], [place], [place]   [id number] [phone] ? [phone] ? [phone] [phone]  [phone] Web site: [email or web address]   Please arrive at least 30 min before your appointment to complete your check in paperwork.   If you are unable to arrive 30 min prior to your appointment time we may have to cancel or reschedule you.

## 2017-10-13 NOTE — Progress Notes (Signed)
Pt discharged home in stable condition after going over discharge instructions with no concerns voiced. AVS and discharge script given before leaving unit
